# Patient Record
Sex: Male | Born: 1990 | Race: White | Hispanic: No | Marital: Single | Smoking: Never smoker
Health system: Southern US, Community
[De-identification: ages and names within clinical notes are randomized; demographics above are authoritative.]

## PROBLEM LIST (undated history)

## (undated) DIAGNOSIS — C439 Malignant melanoma of skin, unspecified: Secondary | ICD-10-CM

## (undated) HISTORY — PX: TONSILLECTOMY: SUR1361

---

## 2010-04-10 ENCOUNTER — Emergency Department: Payer: Self-pay | Admitting: Emergency Medicine

## 2012-06-16 ENCOUNTER — Emergency Department: Payer: Self-pay | Admitting: Emergency Medicine

## 2012-06-16 LAB — URINALYSIS, COMPLETE
Bacteria: NONE SEEN
Bilirubin,UR: NEGATIVE
Glucose,UR: NEGATIVE mg/dL (ref 0–75)
Nitrite: NEGATIVE
Protein: NEGATIVE
Specific Gravity: 1.008 (ref 1.003–1.030)
Squamous Epithelial: NONE SEEN
WBC UR: 2 /HPF (ref 0–5)

## 2012-06-16 IMAGING — US US PELVIS LIMITED
1 series · 14 of 25 positions shown · non-contrast
Comparison: None

REASON FOR EXAM: pain/stinging pain to left testiclar area
COMMENTS:   LMP: (Male)

PROCEDURE:     US  - US TESTICULAR  - June 16, 2012  [DATE]
RESULT:     Indication: Pain
TECHNIQUE: Multiple gray-scale, color-flow Doppler, and spectral waveform
tracings of the testicles and testicular vasculature are presented for
review.

[Series 1: us pelvis limited · 0.08mm/px · 14 of 72 slices shown]
[im 1/72]
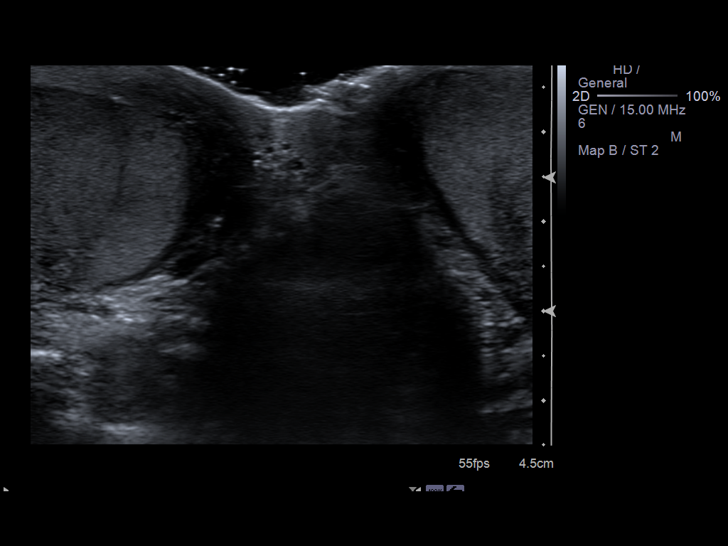
[im 6/72]
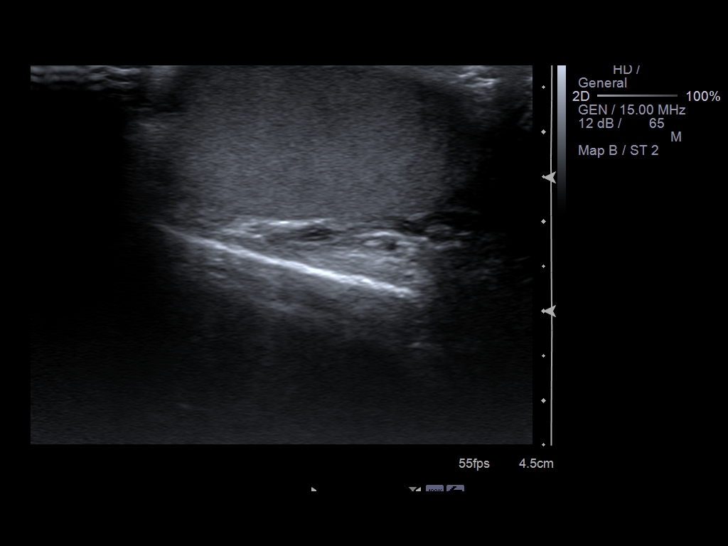
[im 12/72]
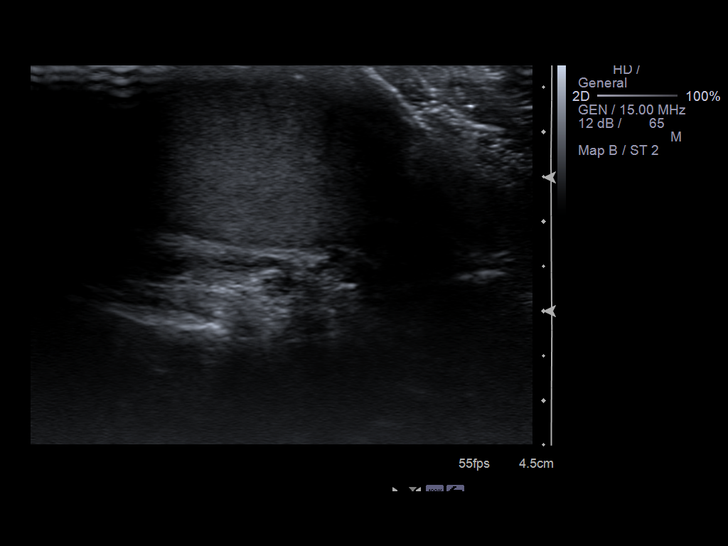
[im 18/72]
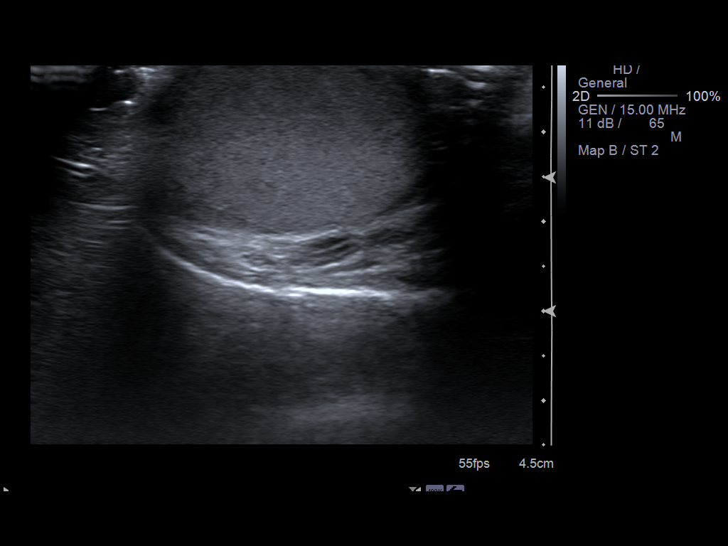
[im 24/72]
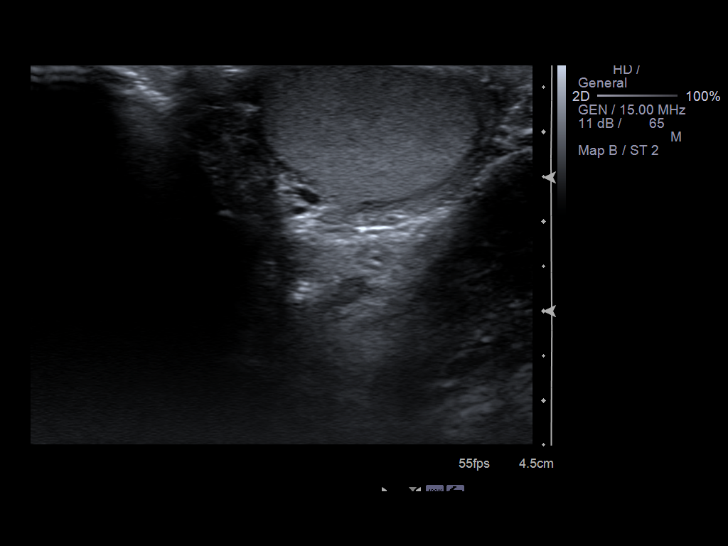
[im 27/72]
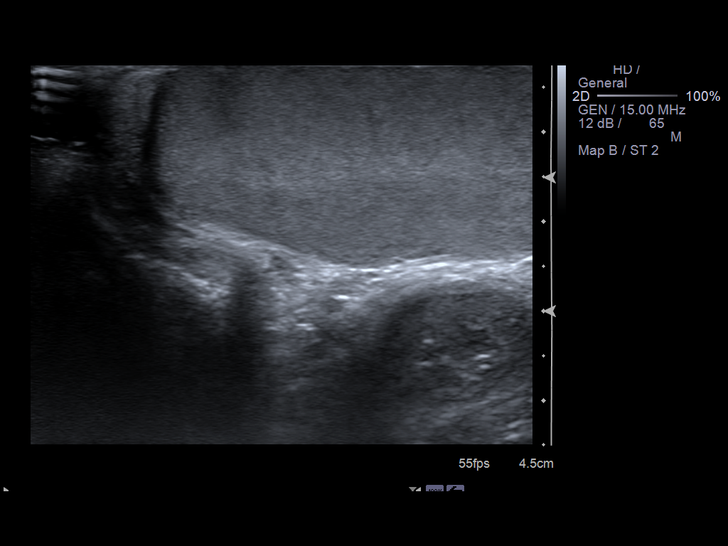
[im 33/72]
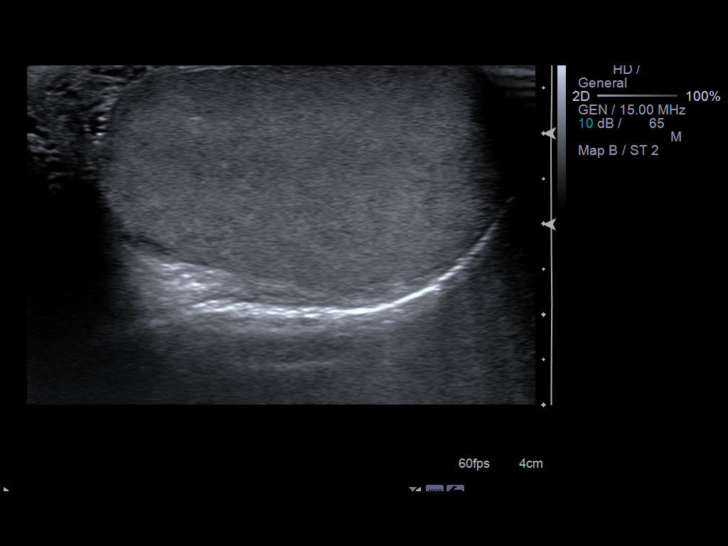
[im 39/72]
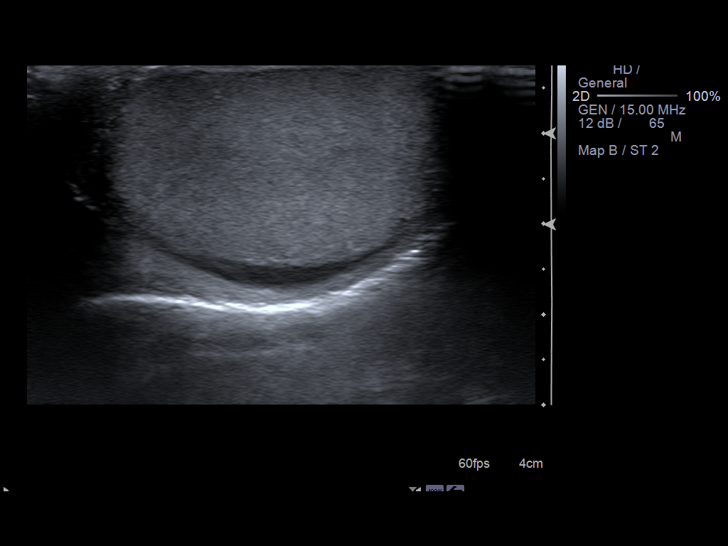
[im 45/72]
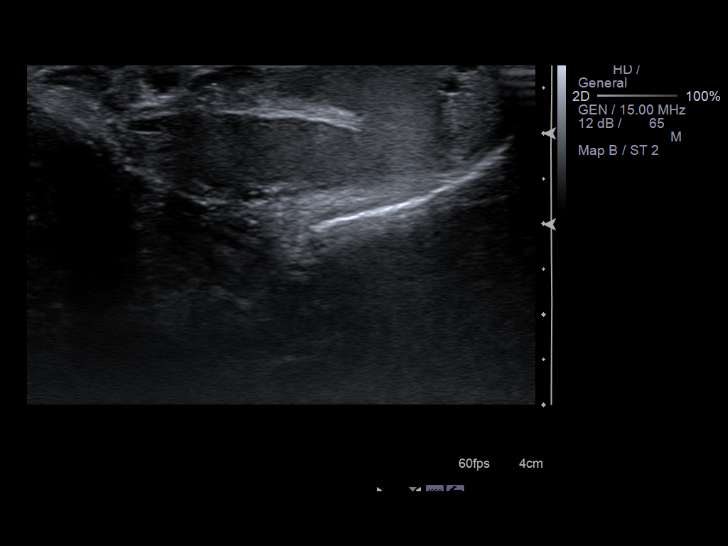
[im 48/72]
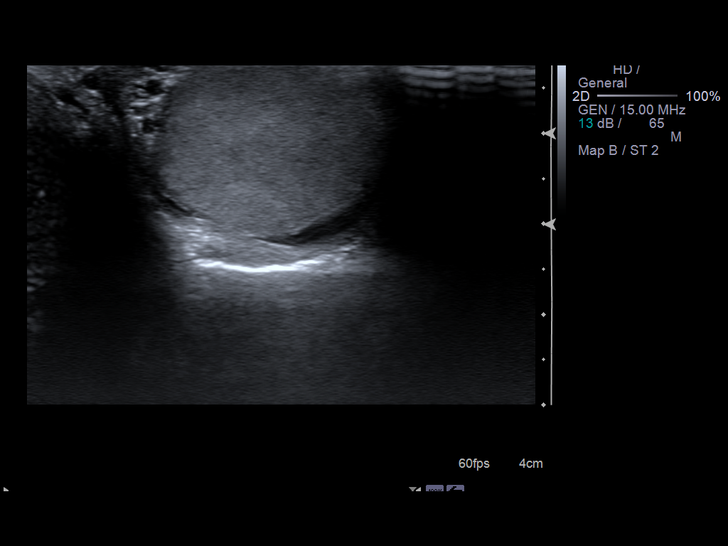
[im 54/72]
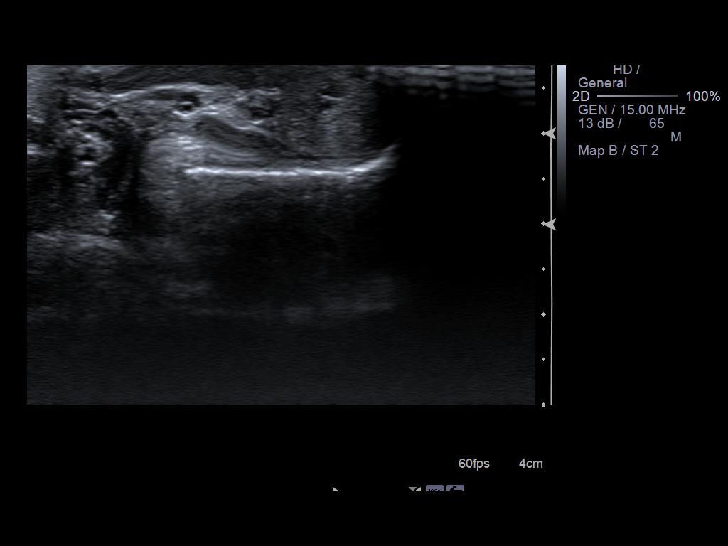
[im 60/72]
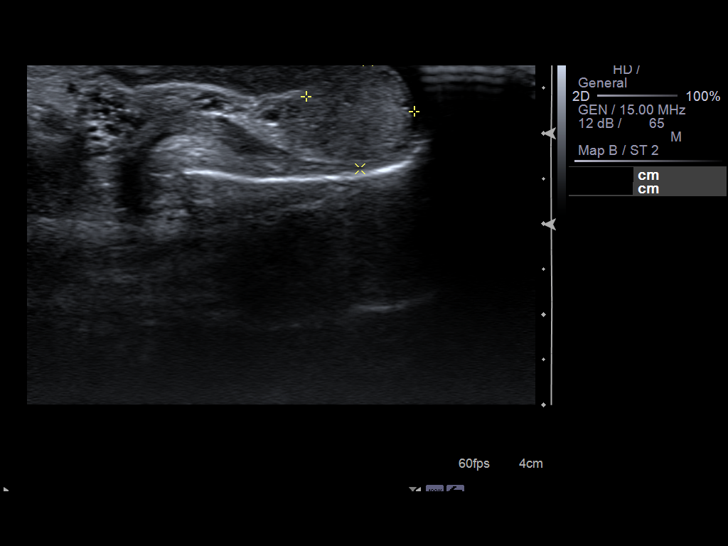
[im 66/72]
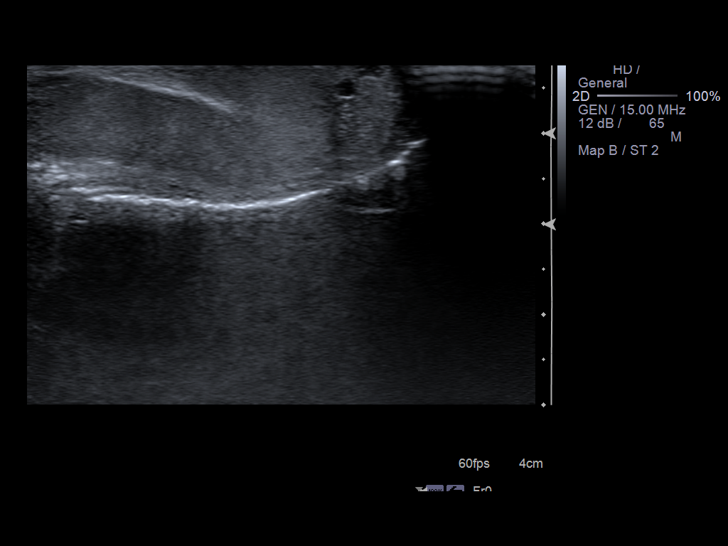
[im 72/72]
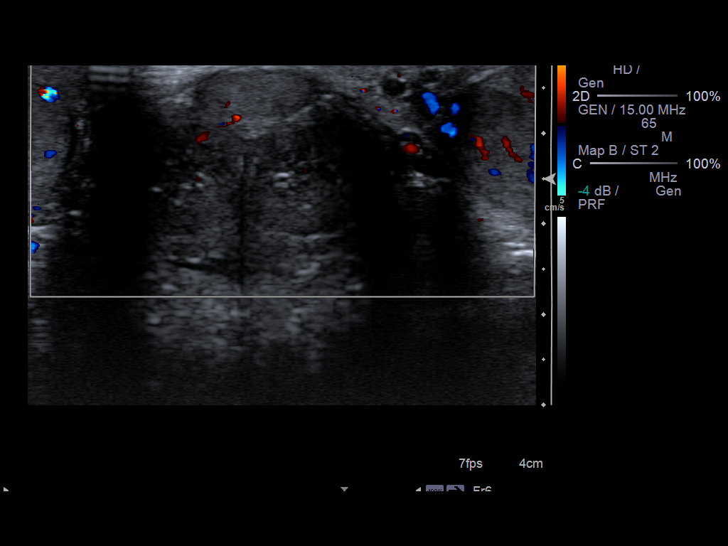

[14 of 25 positions shown; findings below may reference images not displayed]

FINDINGS: The right testicle is of normal echogenicity and measures 4.6 x 3.6 x
cm. No focal mass lesions. There is normal arterial and venous blood flow.
The right epididymal head measures 0.6 cm and is normal in appearance. No
right varicocele or significant hydrocele.

The left testicle is of normal echogenicity and measures 4.5 x 3.4 x 2.7 cm.
No focal mass lesions. There is normal arterial and venous blood flow.  The
left epididymal head measures 1.2 cm and is normal in appearance. There is a
small left epididymal cyst. No left varicocele or significant hydrocele.
IMPRESSION: 1. No active testicular torsion.

[REDACTED]

## 2012-08-05 ENCOUNTER — Emergency Department: Payer: Self-pay | Admitting: Emergency Medicine

## 2012-08-05 IMAGING — CR DG CLAVICLE*R*
1 series · 2 of 2 positions shown · non-contrast
Comparison: none

REASON FOR EXAM: injury
COMMENTS:   May transport without cardiac monitor

PROCEDURE:     DXR - DXR CLAVICLE RIGHT  - August 05, 2012  [DATE]
RESULT:     Clavicle evaluation 08/05/2012.

[Series 1: w clavicle ap right · 0.14mm/px · 2 of 2 slices shown]
[im 1/2]
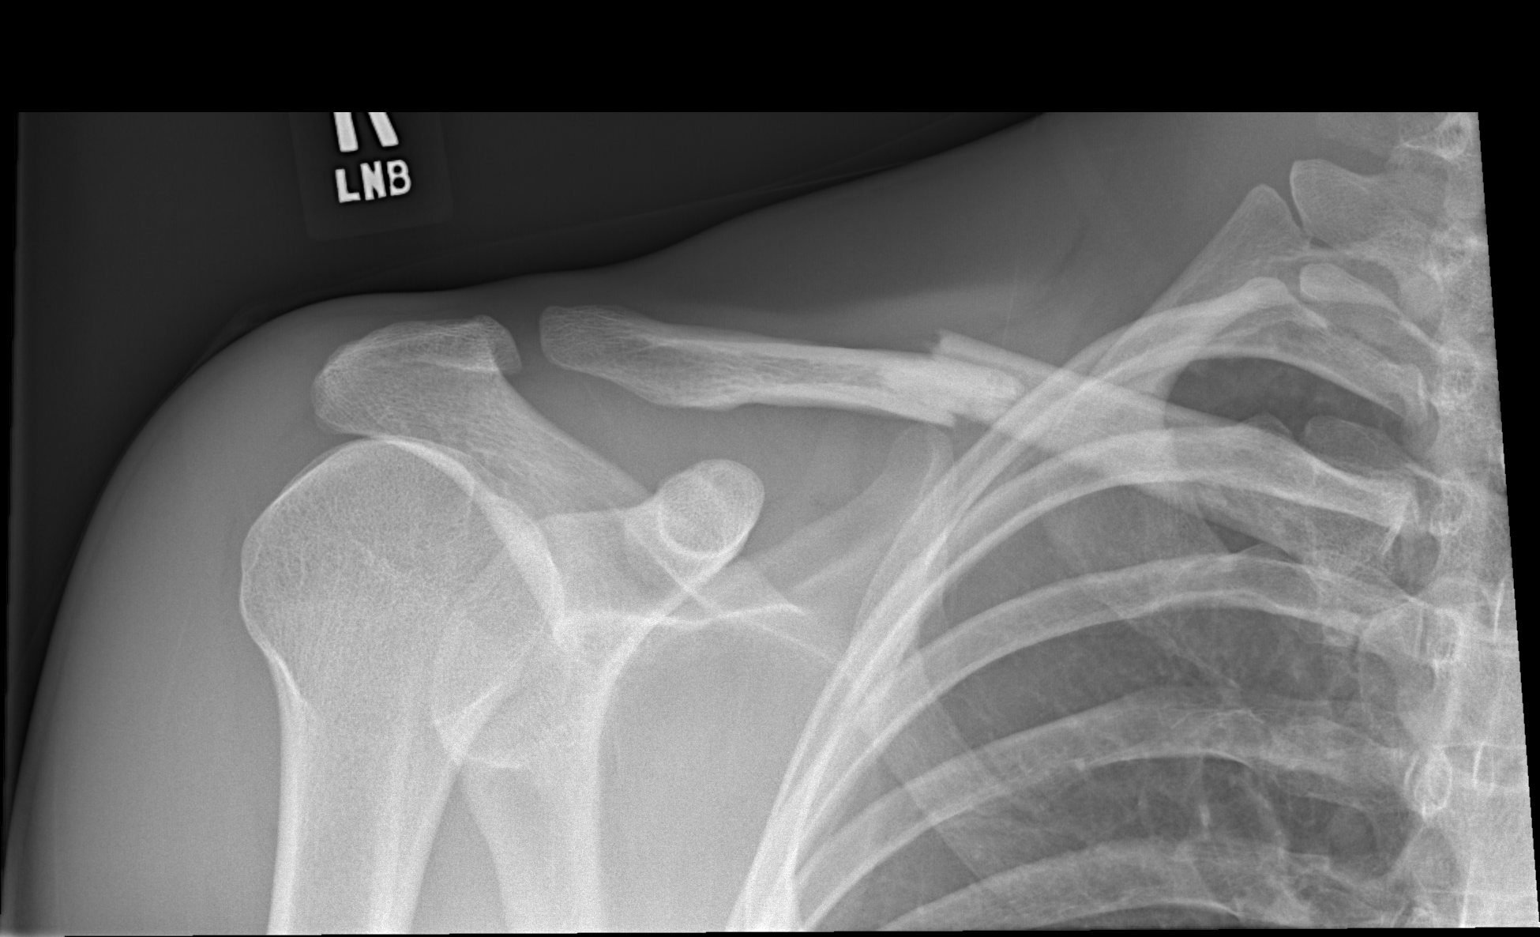
[im 2/2]
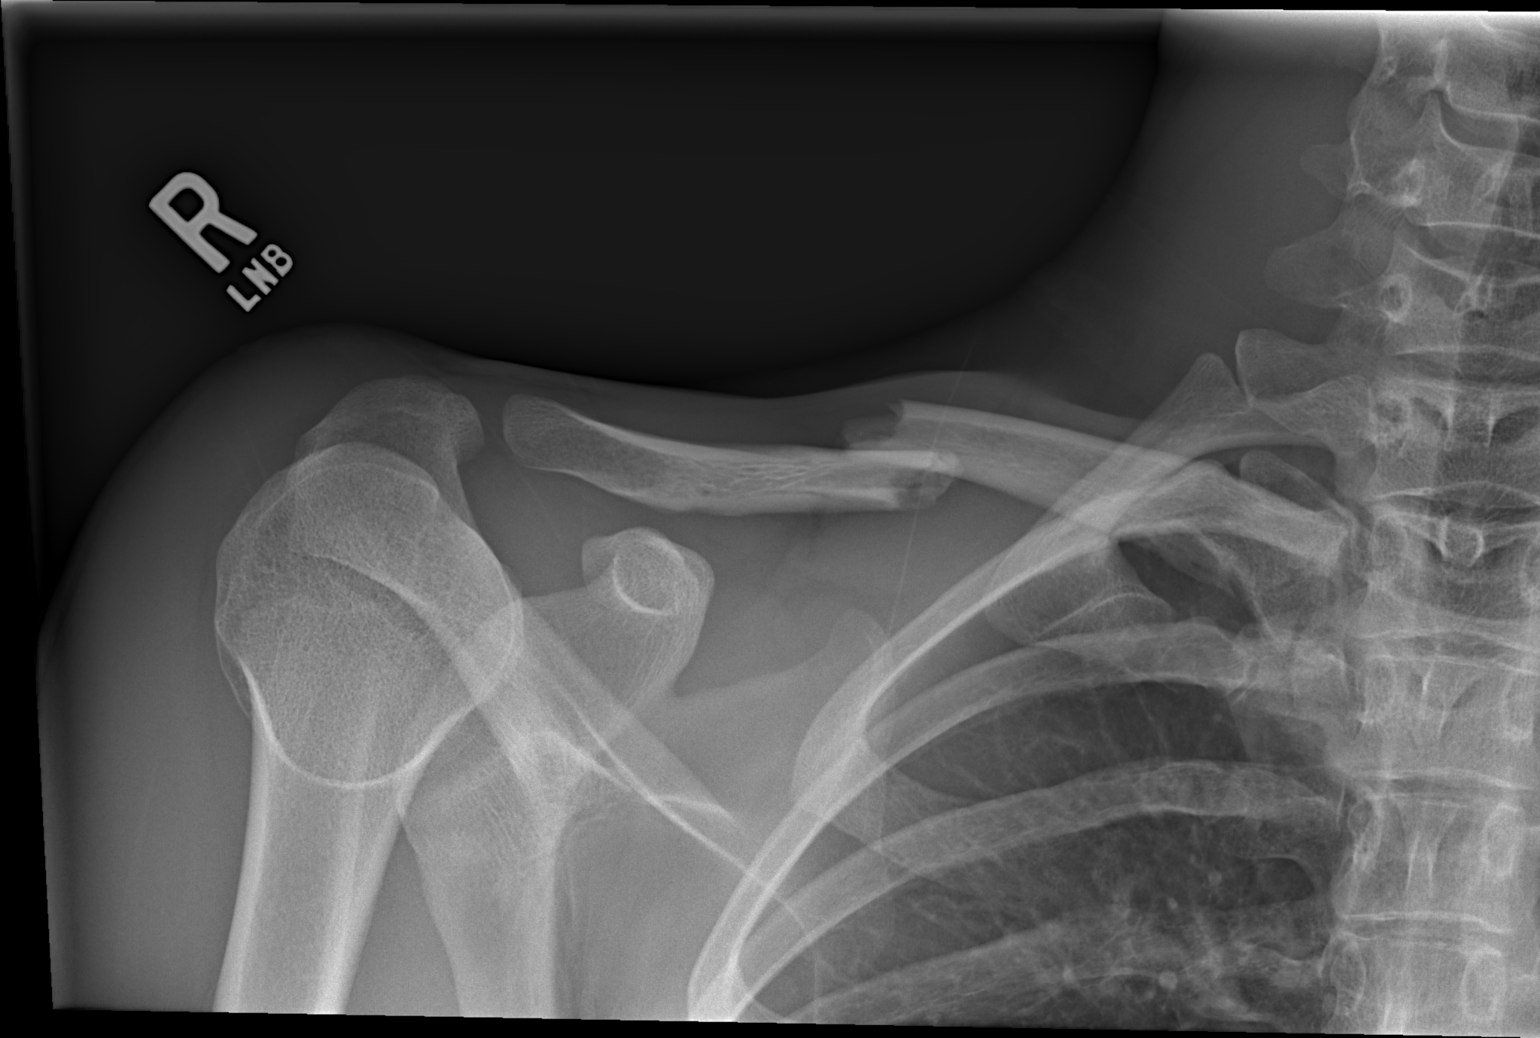

[2 of 2 positions shown; findings below may reference images not displayed]

FINDINGS: A midclavicular fracture is identified with approximately 2 cm of
override. There is no evidence of pneumothorax. The distal fracture fragment
is inferiorly displaced.
IMPRESSION: Midclavicular fracture as described above.

## 2018-12-16 DIAGNOSIS — Z0000 Encounter for general adult medical examination without abnormal findings: Secondary | ICD-10-CM | POA: Diagnosis not present

## 2019-04-21 ENCOUNTER — Other Ambulatory Visit: Payer: Self-pay

## 2019-04-21 DIAGNOSIS — Z20822 Contact with and (suspected) exposure to covid-19: Secondary | ICD-10-CM

## 2019-04-22 LAB — NOVEL CORONAVIRUS, NAA: SARS-CoV-2, NAA: NOT DETECTED

## 2019-11-24 ENCOUNTER — Ambulatory Visit: Payer: Self-pay | Attending: Internal Medicine

## 2019-11-24 DIAGNOSIS — Z23 Encounter for immunization: Secondary | ICD-10-CM

## 2019-11-24 NOTE — Progress Notes (Signed)
   Covid-19 Vaccination Clinic  Name:  Lydon Wenzinger    MRN: [id number] DOB: 05-01-1991  11/24/2019  Mr. Carta was observed post Covid-19 immunization for 15 minutes without incident. He was provided with Vaccine Information Sheet and instruction to access the V-Safe system.   Mr. Spofford was instructed to call 911 with any severe reactions post vaccine: Marland Kitchen Difficulty breathing  . Swelling of face and throat  . A fast heartbeat  . A bad rash all over body  . Dizziness and weakness   Immunizations Administered    Name Date Dose VIS Date Route   Moderna COVID-19 Vaccine 11/24/2019  1:30 PM 0.5 mL 08/15/2019 Intramuscular   Manufacturer: Moderna   Lot: [id number]   CecilPO:[id number]

## 2019-12-26 ENCOUNTER — Ambulatory Visit: Payer: Self-pay | Attending: Internal Medicine

## 2019-12-26 DIAGNOSIS — Z23 Encounter for immunization: Secondary | ICD-10-CM

## 2019-12-26 NOTE — Progress Notes (Signed)
   Covid-19 Vaccination Clinic  Name:  Hunter Sweeney    MRN: [id number] DOB: 09-29-1990  12/26/2019  Hunter Sweeney was observed post Covid-19 immunization for 15 minutes without incident. He was provided with Vaccine Information Sheet and instruction to access the V-Safe system.   Hunter Sweeney was instructed to call 911 with any severe reactions post vaccine: Marland Kitchen Difficulty breathing  . Swelling of face and throat  . A fast heartbeat  . A bad rash all over body  . Dizziness and weakness   Immunizations Administered    Name Date Dose VIS Date Route   Moderna COVID-19 Vaccine 12/26/2019 12:58 PM 0.5 mL 08/15/2019 Intramuscular   Manufacturer: Moderna   Lot: [id number]   DiablockVO:[id number]

## 2020-03-22 ENCOUNTER — Emergency Department: Payer: 59

## 2020-03-22 ENCOUNTER — Other Ambulatory Visit: Payer: Self-pay

## 2020-03-22 ENCOUNTER — Encounter
Admission: EM | Disposition: A | Discharge status: Home / Self Care | Payer: Self-pay | Source: Home / Self Care | Attending: Emergency Medicine

## 2020-03-22 ENCOUNTER — Observation Stay
Admission: EM | Admit: 2020-03-22 | Discharge: 2020-03-23 | Disposition: A | Discharge status: Home / Self Care | Payer: 59 | Attending: Surgery | Admitting: Surgery

## 2020-03-22 ENCOUNTER — Emergency Department: Payer: 59 | Admitting: Anesthesiology

## 2020-03-22 DIAGNOSIS — Z20822 Contact with and (suspected) exposure to covid-19: Secondary | ICD-10-CM | POA: Insufficient documentation

## 2020-03-22 DIAGNOSIS — K3580 Unspecified acute appendicitis: Secondary | ICD-10-CM

## 2020-03-22 DIAGNOSIS — K3589 Other acute appendicitis without perforation or gangrene: Secondary | ICD-10-CM | POA: Diagnosis not present

## 2020-03-22 DIAGNOSIS — K37 Unspecified appendicitis: Secondary | ICD-10-CM | POA: Diagnosis present

## 2020-03-22 DIAGNOSIS — R1031 Right lower quadrant pain: Secondary | ICD-10-CM | POA: Diagnosis present

## 2020-03-22 HISTORY — PX: XI ROBOTIC LAPAROSCOPIC ASSISTED APPENDECTOMY: SHX6877

## 2020-03-22 HISTORY — DX: Malignant melanoma of skin, unspecified: C43.9

## 2020-03-22 LAB — CBC
HCT: 43.6 % (ref 39.0–52.0)
Hemoglobin: 15.2 g/dL (ref 13.0–17.0)
MCH: 30.1 pg (ref 26.0–34.0)
MCHC: 34.9 g/dL (ref 30.0–36.0)
MCV: 86.3 fL (ref 80.0–100.0)
Platelets: 251 10*3/uL (ref 150–400)
RBC: 5.05 MIL/uL (ref 4.22–5.81)
RDW: 12.4 % (ref 11.5–15.5)
WBC: 11.3 10*3/uL — ABNORMAL HIGH (ref 4.0–10.5)
nRBC: 0 % (ref 0.0–0.2)

## 2020-03-22 LAB — URINALYSIS, COMPLETE (UACMP) WITH MICROSCOPIC
Bacteria, UA: NONE SEEN
Bilirubin Urine: NEGATIVE
Glucose, UA: NEGATIVE mg/dL
Hgb urine dipstick: NEGATIVE
Ketones, ur: NEGATIVE mg/dL
Leukocytes,Ua: NEGATIVE
Nitrite: NEGATIVE
Protein, ur: NEGATIVE mg/dL
Specific Gravity, Urine: 1.024 (ref 1.005–1.030)
Squamous Epithelial / HPF: NONE SEEN (ref 0–5)
pH: 5 (ref 5.0–8.0)

## 2020-03-22 LAB — SARS CORONAVIRUS 2 BY RT PCR (HOSPITAL ORDER, PERFORMED IN ~~LOC~~ HOSPITAL LAB): SARS Coronavirus 2: NEGATIVE

## 2020-03-22 LAB — BASIC METABOLIC PANEL
Anion gap: 11 (ref 5–15)
BUN: 16 mg/dL (ref 6–20)
CO2: 24 mmol/L (ref 22–32)
Calcium: 9.2 mg/dL (ref 8.9–10.3)
Chloride: 100 mmol/L (ref 98–111)
Creatinine, Ser: 0.99 mg/dL (ref 0.61–1.24)
GFR calc Af Amer: >60 mL/min (ref >60)
GFR calc non Af Amer: >60 mL/min (ref >60)
Glucose, Bld: 139 mg/dL — ABNORMAL HIGH (ref 70–99)
Potassium: 4 mmol/L (ref 3.5–5.1)
Sodium: 135 mmol/L (ref 135–145)

## 2020-03-22 IMAGING — CT CT ABD-PELV W/ CM
2 of 4 series · 16 of 46 positions shown, 18 images · IV contrast (APPLIED)
Comparison: None.

CLINICAL DATA: Acute right lower quadrant pain since last night

EXAM:
CT ABDOMEN AND PELVIS WITH CONTRAST
TECHNIQUE: Multidetector CT imaging of the abdomen and pelvis was performed
using the standard protocol following bolus administration of
intravenous contrast.
CONTRAST:  100mL OMNIPAQUE IOHEXOL 300 MG/ML  SOLN

[Series 2: routine abd/pel with · axial · 0.68mm/px · z∈[-1063,-633]mm · 13 of 94 slices shown, 15 images]
[im 4/94  soft-tissue]
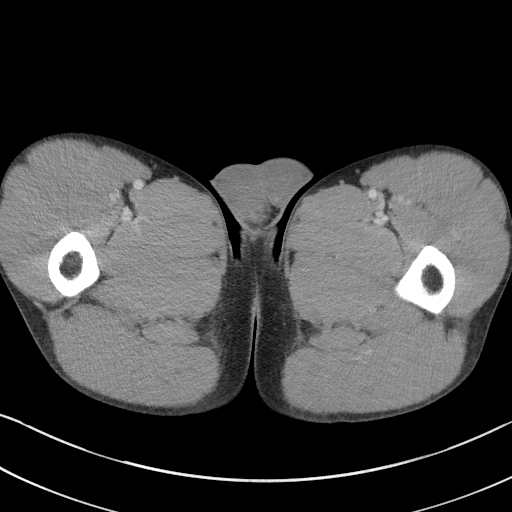
[im 4/94  bone]
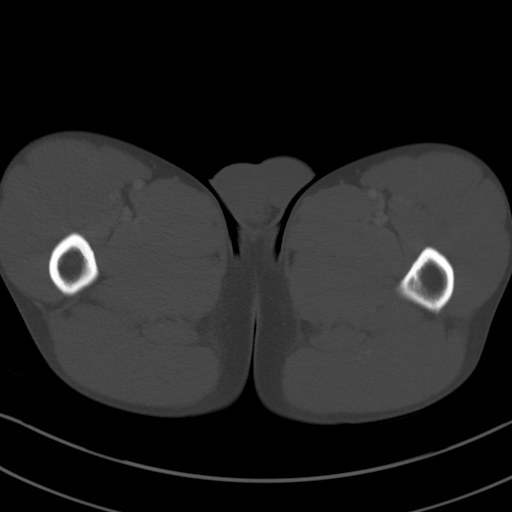
[im 12/94  soft-tissue]
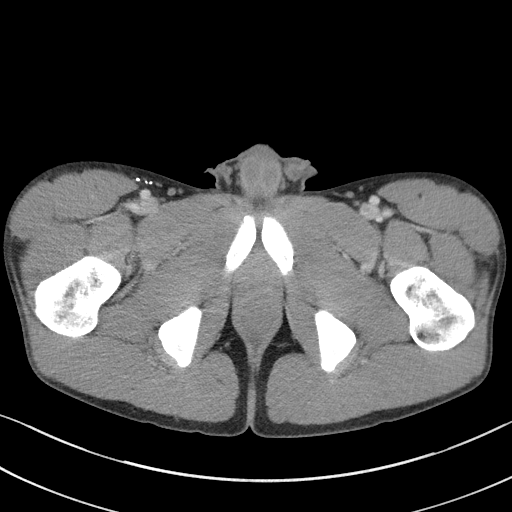
[im 20/94  soft-tissue]
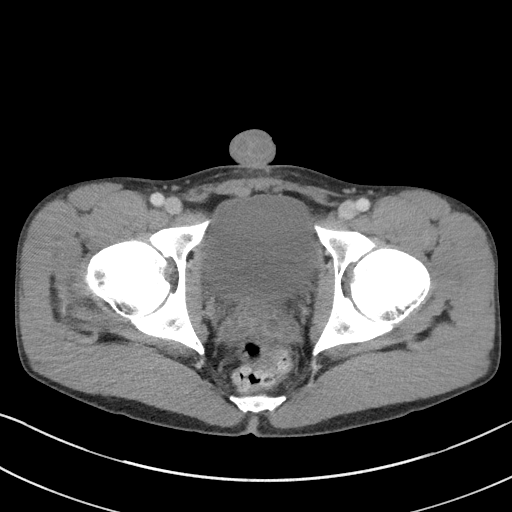
[im 28/94  soft-tissue]
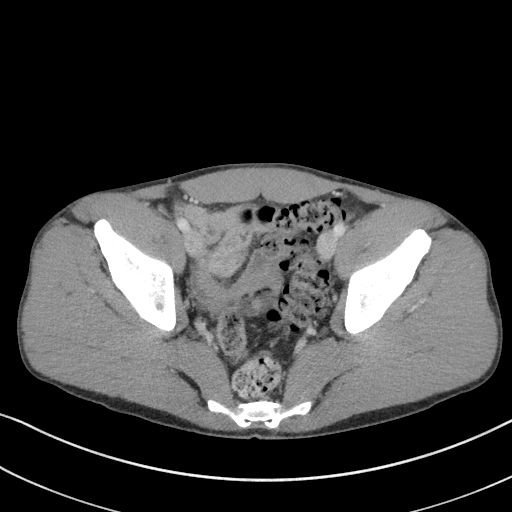
[im 32/94  soft-tissue]
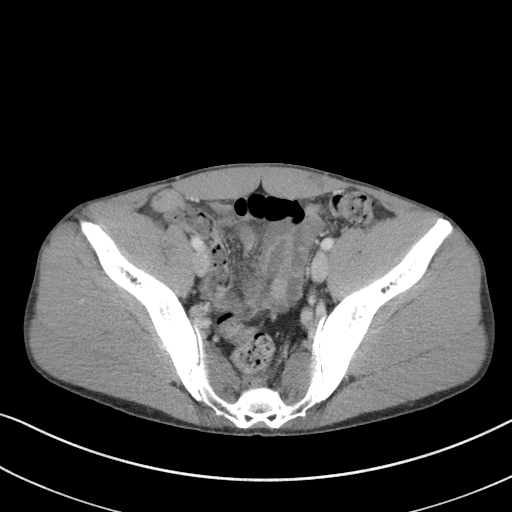
[im 39/94  soft-tissue]
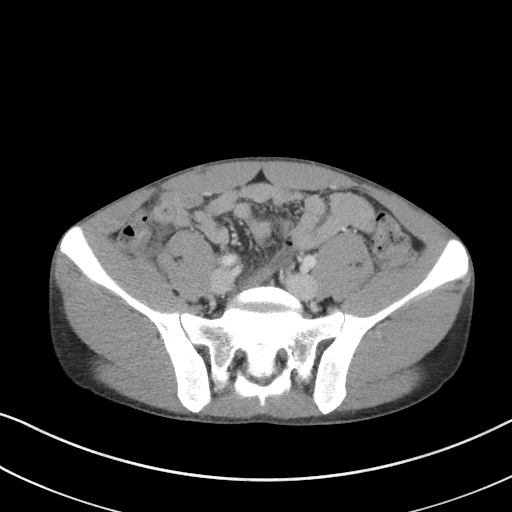
[im 47/94  soft-tissue]
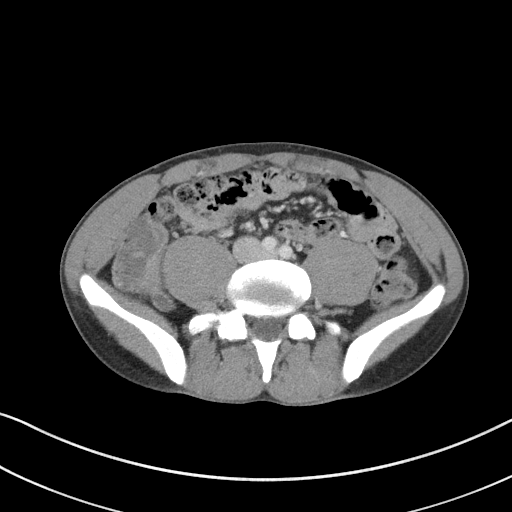
[im 55/94  soft-tissue]
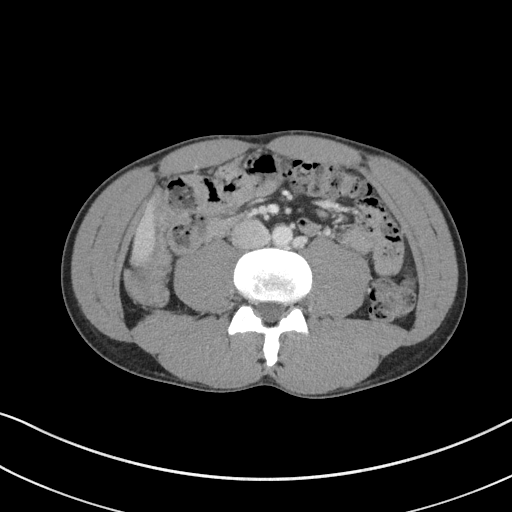
[im 63/94  soft-tissue]
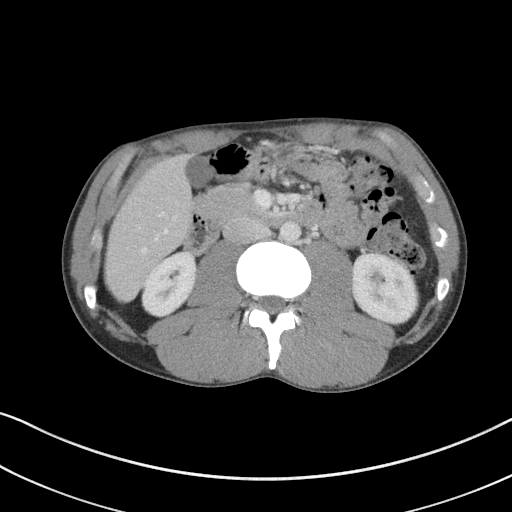
[im 63/94  bone]
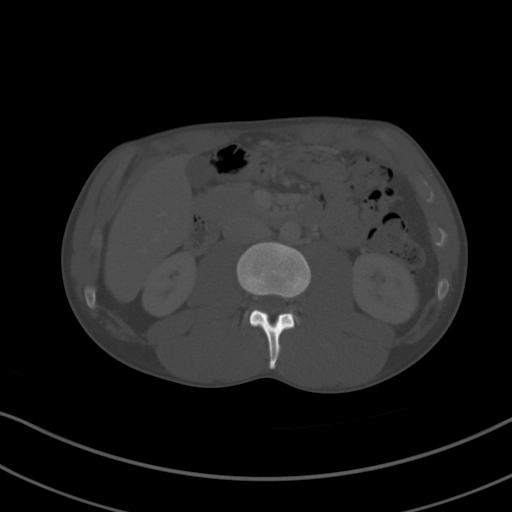
[im 66/94  soft-tissue]
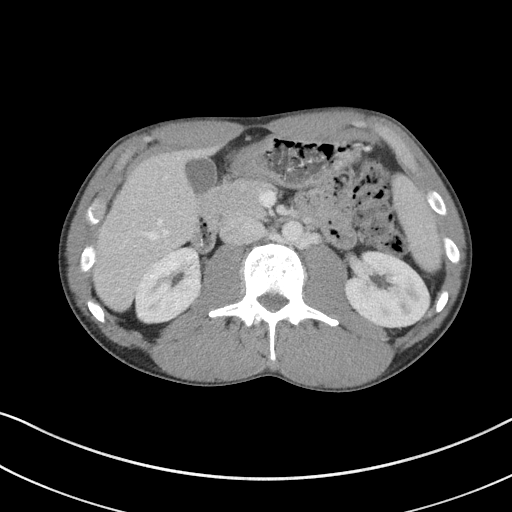
[im 74/94  soft-tissue]
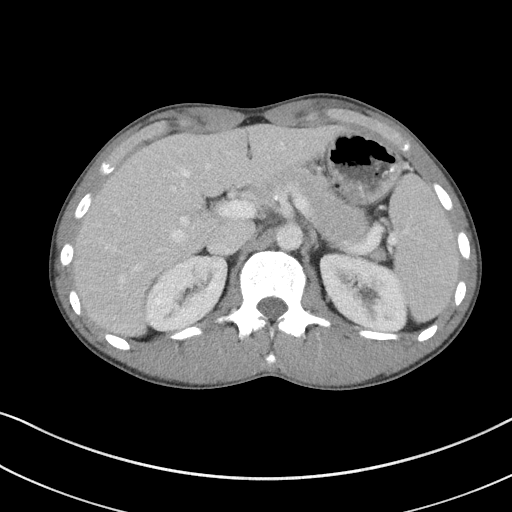
[im 82/94  soft-tissue]
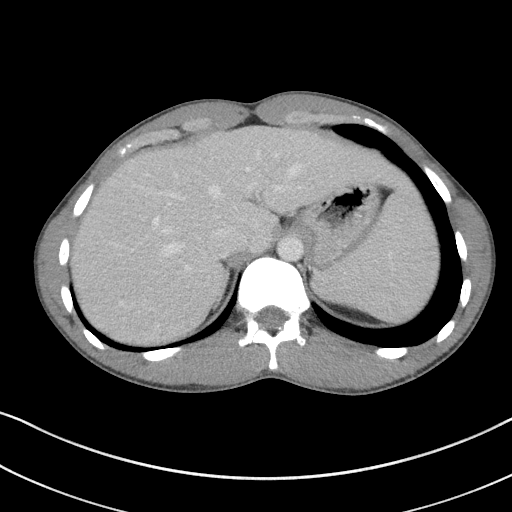
[im 90/94  soft-tissue]
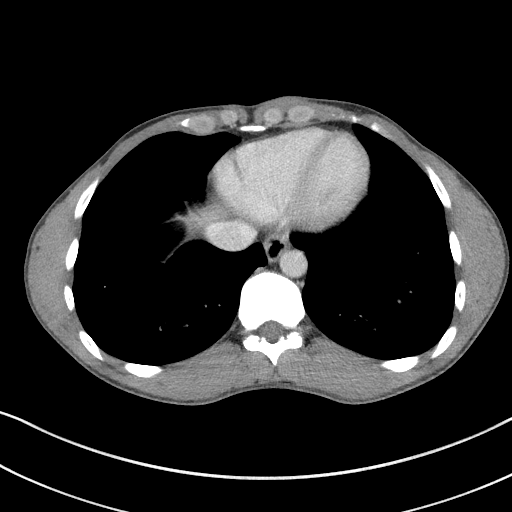

[Series 5: coronal st · coronal · 0.71mm/px · 3 of 81 slices shown]
[im 27/81  soft-tissue]
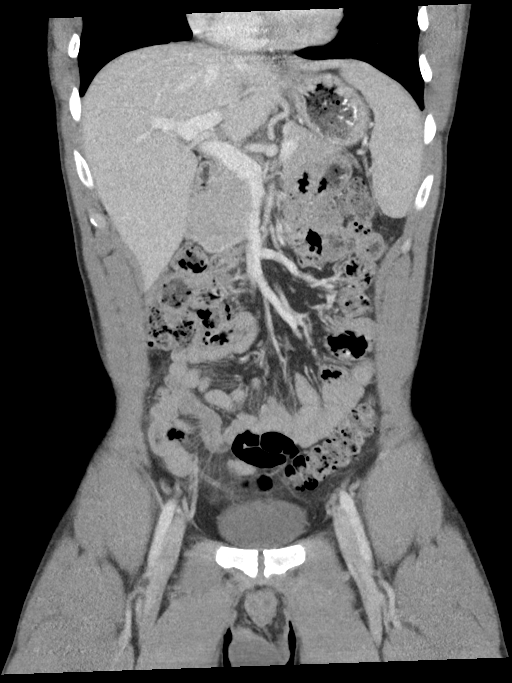
[im 36/81  soft-tissue]
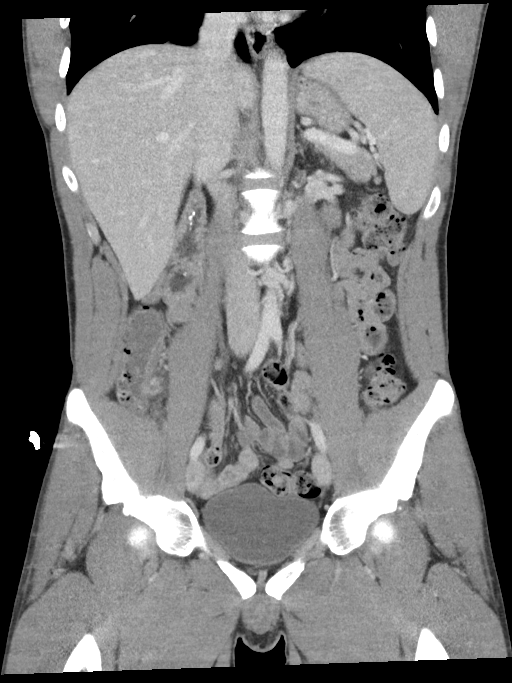
[im 45/81  soft-tissue]
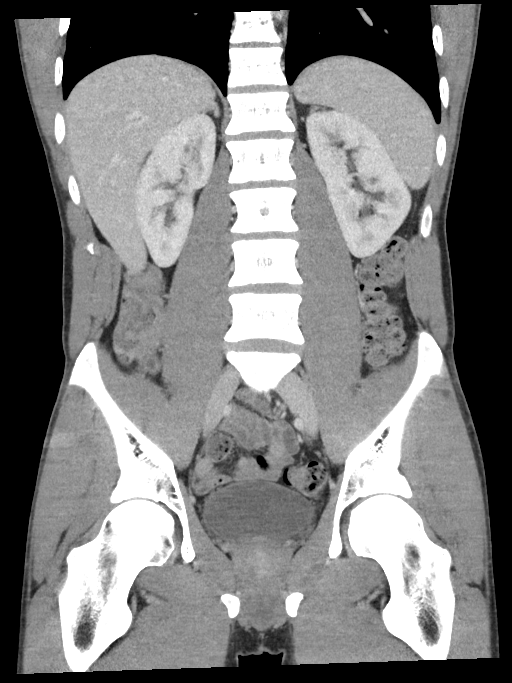

[16 of 46 positions shown; findings below may reference images not displayed]

FINDINGS: Lower chest: No acute abnormality.

Hepatobiliary: No focal liver abnormality is seen. No gallstones,
gallbladder wall thickening, or biliary dilatation.

Pancreas: Unremarkable. No pancreatic ductal dilatation or
surrounding inflammatory changes.

Spleen: Normal in size without focal abnormality.

Adrenals/Urinary Tract: Adrenal glands are unremarkable. Kidneys are
normal, without renal calculi, focal lesion, or hydronephrosis.
Bladder is unremarkable.

Stomach/Bowel: Negative for bowel obstruction, significant
dilatation, ileus, or free air.

In the right lower quadrant the appendix is dilated with mucosal
enhancement and surrounding inflammation. Retrocecal location.
Appearance compatible with acute nonruptured appendicitis.

Appendix: Location: Retrocecal

Diameter: 11 mm

Appendicolith: Not visualized

Mucosal hyper-enhancement: Present

Extraluminal gas: None.

Periappendiceal collection: None.

No significant fluid collection, abscess, hemorrhage or hematoma.
Trace periappendiceal fluid and trace dependent fluid in the pelvis.

Vascular/Lymphatic: No bulky adenopathy

Reproductive: No significant finding by CT

Other: Intact abdominal wall.  No hernia.  No inguinal abnormality.

Musculoskeletal: No acute osseous finding.
IMPRESSION: Acute retrocecal nonruptured appendicitis.

## 2020-03-22 SURGERY — APPENDECTOMY, ROBOT-ASSISTED, LAPAROSCOPIC
Anesthesia: General | Site: Abdomen

## 2020-03-22 MED ORDER — GLYCOPYRROLATE 0.2 MG/ML IJ SOLN
INTRAMUSCULAR | Status: DC | PRN
  Administered 2020-03-22: .2 mg via INTRAVENOUS

## 2020-03-22 MED ORDER — SUCCINYLCHOLINE CHLORIDE 200 MG/10ML IV SOSY
PREFILLED_SYRINGE | INTRAVENOUS | Status: AC
  Filled 2020-03-22: qty 10

## 2020-03-22 MED ORDER — METRONIDAZOLE IN NACL 5-0.79 MG/ML-% IV SOLN
500.0000 mg | Freq: Three times a day (TID) | INTRAVENOUS | Status: DC
  Administered 2020-03-22: 500 mg via INTRAVENOUS
  Filled 2020-03-22 (×3): qty 100

## 2020-03-22 MED ORDER — ACETAMINOPHEN 325 MG PO TABS
650.0000 mg | ORAL_TABLET | Freq: Four times a day (QID) | ORAL | Status: DC
  Administered 2020-03-23: 650 mg via ORAL
  Filled 2020-03-22: qty 2

## 2020-03-22 MED ORDER — PROPOFOL 10 MG/ML IV BOLUS
INTRAVENOUS | Status: DC | PRN
  Administered 2020-03-22: 200 mg via INTRAVENOUS

## 2020-03-22 MED ORDER — HYDROCODONE-ACETAMINOPHEN 5-325 MG PO TABS
1.0000 | ORAL_TABLET | ORAL | Status: DC | PRN
  Administered 2020-03-22: 2 via ORAL
  Administered 2020-03-23: 1 via ORAL
  Filled 2020-03-22: qty 1
  Filled 2020-03-22: qty 2

## 2020-03-22 MED ORDER — LACTATED RINGERS IV SOLN: INTRAVENOUS | Status: DC

## 2020-03-22 MED ORDER — ONDANSETRON HCL 4 MG/2ML IJ SOLN
4.0000 mg | Freq: Once | INTRAMUSCULAR | Status: AC
  Administered 2020-03-22: 4 mg via INTRAVENOUS
  Filled 2020-03-22: qty 2

## 2020-03-22 MED ORDER — ONDANSETRON HCL 4 MG/2ML IJ SOLN
INTRAMUSCULAR | Status: DC | PRN
  Administered 2020-03-22: 4 mg via INTRAVENOUS

## 2020-03-22 MED ORDER — DEXAMETHASONE SODIUM PHOSPHATE 10 MG/ML IJ SOLN
INTRAMUSCULAR | Status: AC
  Filled 2020-03-22: qty 1

## 2020-03-22 MED ORDER — MIDAZOLAM HCL 2 MG/2ML IJ SOLN
INTRAMUSCULAR | Status: AC
  Filled 2020-03-22: qty 2

## 2020-03-22 MED ORDER — GLYCOPYRROLATE 0.2 MG/ML IJ SOLN
INTRAMUSCULAR | Status: AC
  Filled 2020-03-22: qty 1

## 2020-03-22 MED ORDER — LIDOCAINE-EPINEPHRINE (PF) 1 %-1:200000 IJ SOLN
INTRAMUSCULAR | Status: AC
  Filled 2020-03-22: qty 30

## 2020-03-22 MED ORDER — ROCURONIUM BROMIDE 100 MG/10ML IV SOLN
INTRAVENOUS | Status: DC | PRN
  Administered 2020-03-22: 5 mg via INTRAVENOUS
  Administered 2020-03-22: 35 mg via INTRAVENOUS

## 2020-03-22 MED ORDER — OXYCODONE HCL 5 MG PO TABS: 5.0000 mg | ORAL_TABLET | Freq: Once | ORAL | Status: DC | PRN

## 2020-03-22 MED ORDER — BUPIVACAINE HCL (PF) 0.5 % IJ SOLN
INTRAMUSCULAR | Status: DC | PRN
  Administered 2020-03-22: 10 mL

## 2020-03-22 MED ORDER — ENOXAPARIN SODIUM 40 MG/0.4ML ~~LOC~~ SOLN
40.0000 mg | SUBCUTANEOUS | Status: DC
  Filled 2020-03-22: qty 0.4

## 2020-03-22 MED ORDER — MORPHINE SULFATE (PF) 2 MG/ML IV SOLN
1.0000 mg | INTRAVENOUS | Status: DC | PRN
  Administered 2020-03-23: 1 mg via INTRAVENOUS
  Filled 2020-03-22: qty 1

## 2020-03-22 MED ORDER — IOHEXOL 300 MG/ML  SOLN
100.0000 mL | Freq: Once | INTRAMUSCULAR | Status: AC | PRN
  Administered 2020-03-22: 100 mL via INTRAVENOUS

## 2020-03-22 MED ORDER — LIDOCAINE HCL (CARDIAC) PF 100 MG/5ML IV SOSY
PREFILLED_SYRINGE | INTRAVENOUS | Status: DC | PRN
  Administered 2020-03-22: 50 mg via INTRAVENOUS

## 2020-03-22 MED ORDER — CIPROFLOXACIN IN D5W 400 MG/200ML IV SOLN
400.0000 mg | INTRAVENOUS | Status: DC
  Administered 2020-03-22: 400 mg via INTRAVENOUS
  Filled 2020-03-22 (×2): qty 200

## 2020-03-22 MED ORDER — BUPIVACAINE HCL (PF) 0.5 % IJ SOLN
INTRAMUSCULAR | Status: AC
  Filled 2020-03-22: qty 30

## 2020-03-22 MED ORDER — ONDANSETRON HCL 4 MG/2ML IJ SOLN: 4.0000 mg | Freq: Once | INTRAMUSCULAR | Status: DC | PRN

## 2020-03-22 MED ORDER — MIDAZOLAM HCL 2 MG/2ML IJ SOLN
INTRAMUSCULAR | Status: DC | PRN
  Administered 2020-03-22: 2 mg via INTRAVENOUS

## 2020-03-22 MED ORDER — ROCURONIUM BROMIDE 10 MG/ML (PF) SYRINGE
PREFILLED_SYRINGE | INTRAVENOUS | Status: AC
  Filled 2020-03-22: qty 10

## 2020-03-22 MED ORDER — LIDOCAINE HCL (PF) 2 % IJ SOLN
INTRAMUSCULAR | Status: AC
  Filled 2020-03-22: qty 5

## 2020-03-22 MED ORDER — ONDANSETRON 4 MG PO TBDP: 4.0000 mg | ORAL_TABLET | Freq: Four times a day (QID) | ORAL | Status: DC | PRN

## 2020-03-22 MED ORDER — KETOROLAC TROMETHAMINE 30 MG/ML IJ SOLN
INTRAMUSCULAR | Status: AC
  Filled 2020-03-22: qty 1

## 2020-03-22 MED ORDER — FENTANYL CITRATE (PF) 250 MCG/5ML IJ SOLN
INTRAMUSCULAR | Status: AC
  Filled 2020-03-22: qty 5

## 2020-03-22 MED ORDER — OXYCODONE HCL 5 MG/5ML PO SOLN: 5.0000 mg | Freq: Once | ORAL | Status: DC | PRN

## 2020-03-22 MED ORDER — FENTANYL CITRATE (PF) 100 MCG/2ML IJ SOLN
INTRAMUSCULAR | Status: DC | PRN
  Administered 2020-03-22: 50 ug via INTRAVENOUS
  Administered 2020-03-22: 100 ug via INTRAVENOUS
  Administered 2020-03-22 (×2): 50 ug via INTRAVENOUS

## 2020-03-22 MED ORDER — LIDOCAINE-EPINEPHRINE (PF) 1 %-1:200000 IJ SOLN
INTRAMUSCULAR | Status: DC | PRN
  Administered 2020-03-22: 10 mL

## 2020-03-22 MED ORDER — KETOROLAC TROMETHAMINE 30 MG/ML IJ SOLN
INTRAMUSCULAR | Status: DC | PRN
Start: 2020-03-22 — End: 2020-03-22
  Administered 2020-03-22: 30 mg via INTRAVENOUS

## 2020-03-22 MED ORDER — DEXAMETHASONE SODIUM PHOSPHATE 10 MG/ML IJ SOLN
INTRAMUSCULAR | Status: DC | PRN
  Administered 2020-03-22: 10 mg via INTRAVENOUS

## 2020-03-22 MED ORDER — IBUPROFEN 400 MG PO TABS: 600.0000 mg | ORAL_TABLET | Freq: Four times a day (QID) | ORAL | Status: DC | PRN

## 2020-03-22 MED ORDER — FENTANYL CITRATE (PF) 100 MCG/2ML IJ SOLN
25.0000 ug | INTRAMUSCULAR | Status: DC | PRN
  Administered 2020-03-22 (×3): 25 ug via INTRAVENOUS

## 2020-03-22 MED ORDER — ONDANSETRON HCL 4 MG/2ML IJ SOLN: 4.0000 mg | Freq: Four times a day (QID) | INTRAMUSCULAR | Status: DC | PRN

## 2020-03-22 MED ORDER — SUGAMMADEX SODIUM 200 MG/2ML IV SOLN
INTRAVENOUS | Status: DC | PRN
  Administered 2020-03-22: 200 mg via INTRAVENOUS

## 2020-03-22 MED ORDER — SUCCINYLCHOLINE CHLORIDE 20 MG/ML IJ SOLN
INTRAMUSCULAR | Status: DC | PRN
  Administered 2020-03-22: 100 mg via INTRAVENOUS

## 2020-03-22 MED ORDER — ONDANSETRON HCL 4 MG/2ML IJ SOLN
INTRAMUSCULAR | Status: AC
  Filled 2020-03-22: qty 2

## 2020-03-22 MED ORDER — TRAMADOL HCL 50 MG PO TABS: 50.0000 mg | ORAL_TABLET | Freq: Four times a day (QID) | ORAL | Status: DC | PRN

## 2020-03-22 MED ORDER — MORPHINE SULFATE (PF) 4 MG/ML IV SOLN
4.0000 mg | Freq: Once | INTRAVENOUS | Status: DC
  Filled 2020-03-22: qty 1

## 2020-03-22 MED ORDER — FENTANYL CITRATE (PF) 100 MCG/2ML IJ SOLN
INTRAMUSCULAR | Status: AC
  Administered 2020-03-22: 25 ug via INTRAVENOUS
  Filled 2020-03-22: qty 2

## 2020-03-22 SURGICAL SUPPLY — 63 items
ANCHOR TIS RET SYS 235ML (MISCELLANEOUS) IMPLANT
BAG INFUSER PRESSURE 100CC (MISCELLANEOUS) IMPLANT
BLADE SURG SZ11 CARB STEEL (BLADE) ×3 IMPLANT
CANISTER SUCT 1200ML W/VALVE (MISCELLANEOUS) ×3 IMPLANT
CANNULA REDUC XI 12-8 STAPL (CANNULA) ×1
CANNULA REDUC XI 12-8MM STAPL (CANNULA) ×1
CANNULA REDUCER 12-8 DVNC XI (CANNULA) ×1 IMPLANT
CHLORAPREP W/TINT 26 (MISCELLANEOUS) ×3 IMPLANT
COVER TIP SHEARS 8 DVNC (MISCELLANEOUS) ×1 IMPLANT
COVER TIP SHEARS 8MM DA VINCI (MISCELLANEOUS) ×2
COVER WAND RF STERILE (DRAPES) IMPLANT
DEFOGGER SCOPE WARMER CLEARIFY (MISCELLANEOUS) ×3 IMPLANT
DERMABOND ADVANCED (GAUZE/BANDAGES/DRESSINGS) ×2
DERMABOND ADVANCED .7 DNX12 (GAUZE/BANDAGES/DRESSINGS) ×1 IMPLANT
DRAPE ARM DVNC X/XI (DISPOSABLE) ×4 IMPLANT
DRAPE COLUMN DVNC XI (DISPOSABLE) ×1 IMPLANT
DRAPE DA VINCI XI ARM (DISPOSABLE) ×8
DRAPE DA VINCI XI COLUMN (DISPOSABLE) ×2
ELECT CAUTERY BLADE 6.4 (BLADE) ×3 IMPLANT
ELECT REM PT RETURN 9FT ADLT (ELECTROSURGICAL) ×3
ELECTRODE REM PT RTRN 9FT ADLT (ELECTROSURGICAL) ×1 IMPLANT
GLOVE BIOGEL PI IND STRL 7.0 (GLOVE) ×2 IMPLANT
GLOVE BIOGEL PI INDICATOR 7.0 (GLOVE) ×4
GLOVE SURG SYN 6.5 ES PF (GLOVE) ×6 IMPLANT
GOWN STRL REUS W/ TWL LRG LVL3 (GOWN DISPOSABLE) ×3 IMPLANT
GOWN STRL REUS W/TWL LRG LVL3 (GOWN DISPOSABLE) ×6
GRASPER SUT TROCAR 14GX15 (MISCELLANEOUS) IMPLANT
IRRIGATOR SUCT 8 DISP DVNC XI (IRRIGATION / IRRIGATOR) IMPLANT
IRRIGATOR SUCTION 8MM XI DISP (IRRIGATION / IRRIGATOR)
IV NS 1000ML (IV SOLUTION)
IV NS 1000ML BAXH (IV SOLUTION) IMPLANT
KIT PINK PAD W/HEAD ARE REST (MISCELLANEOUS) ×3
KIT PINK PAD W/HEAD ARM REST (MISCELLANEOUS) ×1 IMPLANT
KIT TURNOVER KIT A (KITS) ×3 IMPLANT
LABEL OR SOLS (LABEL) IMPLANT
NEEDLE HYPO 22GX1.5 SAFETY (NEEDLE) ×3 IMPLANT
NEEDLE INSUFFLATION 14GA 120MM (NEEDLE) ×3 IMPLANT
OBTURATOR OPTICAL STANDARD 8MM (TROCAR) ×2
OBTURATOR OPTICAL STND 8 DVNC (TROCAR) ×1
OBTURATOR OPTICALSTD 8 DVNC (TROCAR) ×1 IMPLANT
PACK LAP CHOLECYSTECTOMY (MISCELLANEOUS) ×3 IMPLANT
PENCIL ELECTRO HAND CTR (MISCELLANEOUS) ×3 IMPLANT
RELOAD STAPLER 2.5X45 WHT DVNC (STAPLE) ×1 IMPLANT
RELOAD STAPLER 3.5X45 BLU DVNC (STAPLE) ×2 IMPLANT
SEAL CANN UNIV 5-8 DVNC XI (MISCELLANEOUS) ×3 IMPLANT
SEAL XI 5MM-8MM UNIVERSAL (MISCELLANEOUS) ×6
SET TUBE SMOKE EVAC HIGH FLOW (TUBING) ×3 IMPLANT
SOLUTION ELECTROLUBE (MISCELLANEOUS) ×3 IMPLANT
STAPLER 45 DA VINCI SURE FORM (STAPLE) ×2
STAPLER 45 SUREFORM DVNC (STAPLE) ×1 IMPLANT
STAPLER CANNULA SEAL DVNC XI (STAPLE) ×1 IMPLANT
STAPLER CANNULA SEAL XI (STAPLE) ×2
STAPLER RELOAD 2.5X45 WHITE (STAPLE) ×2
STAPLER RELOAD 2.5X45 WHT DVNC (STAPLE) ×1
STAPLER RELOAD 3.5X45 BLU DVNC (STAPLE) ×2
STAPLER RELOAD 3.5X45 BLUE (STAPLE) ×4
SUT MNCRL AB 4-0 PS2 18 (SUTURE) ×3 IMPLANT
SUT VIC AB 3-0 SH 27 (SUTURE) ×2
SUT VIC AB 3-0 SH 27X BRD (SUTURE) ×1 IMPLANT
SUT VICRYL 0 AB UR-6 (SUTURE) ×3 IMPLANT
SYR 30ML LL (SYRINGE) ×3 IMPLANT
SYSTEM WECK SHIELD CLOSURE (TROCAR) IMPLANT
TRAY FOLEY MTR SLVR 16FR STAT (SET/KITS/TRAYS/PACK) IMPLANT

## 2020-03-22 NOTE — ED Notes (Addendum)
Report given to [facility] in [place]. Patient is scheduled for procedure around 1900 today. Patient states he last ate around 0800 today which was two small pieces of cantaloupe and 1/2 glass of water.

## 2020-03-22 NOTE — ED Notes (Signed)
Pt given urine cup for collection when able

## 2020-03-22 NOTE — Anesthesia Preprocedure Evaluation (Signed)
Anesthesia Evaluation  Patient identified by MRN, date of birth, ID band Patient awake    Reviewed: Allergy & Precautions, H&P , NPO status , Patient's Chart, lab work & pertinent test results  Airway Mallampati: I  TM Distance: >3 FB Neck ROM: full    Dental  (+) Teeth Intact   Pulmonary neg pulmonary ROS,    Pulmonary exam normal        Cardiovascular negative cardio ROS Normal cardiovascular exam(-) dysrhythmias      Neuro/Psych negative neurological ROS  negative psych ROS   GI/Hepatic negative GI ROS, Neg liver ROS,   Endo/Other  negative endocrine ROS  Renal/GU      Musculoskeletal   Abdominal   Peds  Hematology negative hematology ROS (+)   Anesthesia Other Findings Past Medical History: No date: Melanoma ([place])  Past Surgical History: No date: TONSILLECTOMY  BMI    Body Mass Index: 20.92 kg/m      Reproductive/Obstetrics negative OB ROS                             Anesthesia Physical Anesthesia Plan  ASA: I  Anesthesia Plan: General ETT   Post-op Pain Management:    Induction:   PONV Risk Score and Plan: Ondansetron, Dexamethasone, Midazolam and Treatment may vary due to age or medical condition  Airway Management Planned:   Additional Equipment:   Intra-op Plan:   Post-operative Plan:   Informed Consent: I have reviewed the patients History and Physical, chart, labs and discussed the procedure including the risks, benefits and alternatives for the proposed anesthesia with the patient or authorized representative who has indicated his/her understanding and acceptance.     Dental Advisory Given  Plan Discussed with: Anesthesiologist, CRNA and Surgeon  Anesthesia Plan Comments:         Anesthesia Quick Evaluation

## 2020-03-22 NOTE — Transfer of Care (Signed)
Immediate Anesthesia Transfer of Care Note  Patient: Hunter Sweeney  Procedure(s) Performed: XI ROBOTIC LAPAROSCOPIC ASSISTED APPENDECTOMY (N/A Abdomen)  Patient Location: PACU  Anesthesia Type:General  Level of Consciousness: sedated  Airway & Oxygen Therapy: Patient Spontanous Breathing and Patient connected to face mask oxygen  Post-op Assessment: Report given to RN and Post -op Vital signs reviewed and stable  Post vital signs: Reviewed  Last Vitals:  Vitals Value Taken Time  BP    Temp    Pulse 52 03/22/20 1909  Resp 12 03/22/20 1909  SpO2 99 % 03/22/20 1909  Vitals shown include unvalidated device data.  Last Pain:  Vitals:   03/22/20 1525  TempSrc:   PainSc: 8          Complications: No complications documented.

## 2020-03-22 NOTE — ED Notes (Signed)
Pt wishes to hold off on morphine at this time

## 2020-03-22 NOTE — Anesthesia Procedure Notes (Signed)
Procedure Name: Intubation Performed by: Hayden Mabin, CRNA Pre-anesthesia Checklist: Patient identified, Patient being monitored, Timeout performed, Emergency Drugs available and Suction available Patient Re-evaluated:Patient Re-evaluated prior to induction Oxygen Delivery Method: Circle system utilized Preoxygenation: Pre-oxygenation with 100% oxygen Induction Type: IV induction and Rapid sequence Laryngoscope Size: McGraph and 4 Grade View: Grade I Tube type: Oral Tube size: 7.5 mm Number of attempts: 1 Airway Equipment and Method: Stylet and Video-laryngoscopy Placement Confirmation: ETT inserted through vocal cords under direct vision,  positive ETCO2 and breath sounds checked- equal and bilateral Secured at: 22 cm Tube secured with: Tape Dental Injury: Teeth and Oropharynx as per pre-operative assessment        

## 2020-03-22 NOTE — Anesthesia Postprocedure Evaluation (Signed)
Anesthesia Post Note  Patient: Hunter Sweeney  Procedure(s) Performed: XI ROBOTIC LAPAROSCOPIC ASSISTED APPENDECTOMY (N/A Abdomen)  Patient location during evaluation: PACU Anesthesia Type: General Level of consciousness: awake and alert Pain management: pain level controlled Vital Signs Assessment: post-procedure vital signs reviewed and stable Respiratory status: spontaneous breathing, nonlabored ventilation and respiratory function stable Cardiovascular status: blood pressure returned to baseline and stable Postop Assessment: no apparent nausea or vomiting Anesthetic complications: no   No complications documented.   Last Vitals:  Vitals:   03/22/20 2053 03/22/20 2111  BP: 119/65 114/65  Pulse: (!) 57 (!) 50  Resp: 13 16  Temp: 36.5 C (!) 36.4 C  SpO2: 100% 100%    Last Pain:  Vitals:   03/22/20 2111  TempSrc: Oral  PainSc:                  Tera Mater

## 2020-03-22 NOTE — ED Triage Notes (Addendum)
Pt c/o RLQ pain that started last night, pt was sent from [facility]. Denies N/V/D. last BM was yesterday States they resulted labs at [facility]

## 2020-03-22 NOTE — Op Note (Signed)
Preoperative diagnosis: acute appendicitis  Postoperative diagnosis: Same  Procedure: Robotic assisted laparoscopic appendectomy.  Anesthesia: GETA  Surgeon: Benjamine Sprague  Wound Classification: clean contaminated  Specimen: Appendix  Complications: None  Estimated Blood Loss: 3 mL   Indications: Patient is a 29 y.o. male  presented with above.  Please see H&P for further details.    FIndings: 1.  Irritated appendix  2. No peri-appendiceal abscess or phlegmon 3. Normal anatomy 4. Appendiceal artery ligated and divided with stapler 5. Adequate hemostasis.   Description of procedure: The patient was placed on the operating table in the supine position, left arm tucked. General anesthesia was induced. A time-out was completed verifying correct patient, procedure, site, positioning, and implant(s) and/or special equipment prior to beginning this procedure. The abdomen was prepped and draped in the usual sterile fashion.   Palmer's point located and Veress needle was inserted.  After confirming 2 clicks and a positive saline drop test, gas insufflation was initiated until the abdominal pressure was measured at 15 mmHg.  Afterwards, the Veress needle was removed and a 8 mm port was placed through a periumbilical site using Optiview technique after incision with an 11 blade.  After local was infused, 2 additional incision was made 8 cm apart each side along the left side of the abdominal wall from the initial incision.  An 8 mm port was caudaed and 35mm port cephalad from initial incision, both under direct visualization.  No injuries from trocar placements were noted. The table was placed in the Trendelenburg position with the right side elevated.  Xi robotic platform was then brought to the operative field and docked.  An inflamed appendix was identified in the retrocecal space and elevated into view by retracting the colon medially.  Loose attachments to the surrounding tissue were  dissected using scissors.  White load was then used to transect the mesoappendix and its surrounding attachments to further free up the appendix and to visualize the base. A blue load linear cutting stapler was then used to divide and staple the base of the appendix.  Bleeding from the staple lines were controlled with electrocautery.  The appendix was placed in an endoscopic retrieval bag and removed.   The appendiceal stump and mesoappendix staple line examined again and hemostasis noted. No other pathology was identified within pelvis. The 12 mm trocar removed and port site closed with PMI using 0 vicryl under direct vision. Remaining trocars were removed under direct vision. No bleeding was noted.The abdomen was allowed to collapse. 3-0 vicryl interrupted used to close 32mm port site at dermal level, and then all skin incisions then closed with subcuticular sutures Monocryl 4-0.  Wounds then dressed with dermabond.  The patient tolerated the procedure well, awakened from anesthesia and was taken to the postanesthesia care unit in satisfactory condition.  Sponge count and instrument count correct at the end of the procedure.

## 2020-03-22 NOTE — ED Provider Notes (Signed)
[facility] Emergency Department Provider Note   ____________________________________________    I have reviewed the triage vital signs and the nursing notes.   HISTORY  Chief Complaint Abdominal Pain     HPI Hunter Sweeney is a 29 y.o. male who presents with complaints of abdominal pain.  Patient reports last night he developed pressure in his right lower quadrant, it woke him up a couple of times overnight and this morning he continued to have pain so he saw his PCP.  PCP referred him to the emergency department for concern for appendicitis.  Patient denies a history of abdominal surgery.  Is not taken anything for this.  No fevers or chills.  No nausea or vomiting.  Reports the pain is moderate in intensity.  No radiation.  No history of kidney stones.  No hematuria or dysuria.  Past Medical History:  Diagnosis Date  . Melanoma ([place])     There are no problems to display for this patient.   Past Surgical History:  Procedure Laterality Date  . TONSILLECTOMY      Prior to Admission medications   Not on File     Allergies Amoxicillin  No family history on file.  Social History Social History   Tobacco Use  . Smoking status: Never Smoker  . Smokeless tobacco: Never Used  Substance Use Topics  . Alcohol use: Yes  . Drug use: Not Currently    Review of Systems  Constitutional: No fever/chills Eyes: No visual changes.  ENT: No sore throat. Cardiovascular: Denies chest pain. Respiratory: Denies shortness of breath. Gastrointestinal: As above Genitourinary: As above Musculoskeletal: Negative for back pain. Skin: Negative for rash. Neurological: Negative for headaches or weakness   ____________________________________________   PHYSICAL EXAM:  VITAL SIGNS: ED Triage Vitals  Enc Vitals Group     BP 03/22/20 0920 134/66     Pulse Rate 03/22/20 0920 66     Resp 03/22/20 0920 16     Temp 03/22/20 0923 97.7 F (36.5 C)      Temp Source 03/22/20 0920 Oral     SpO2 03/22/20 0920 100 %     Weight 03/22/20 0921 68 kg (150 lb)     Height 03/22/20 0921 1.803 m (5\' 11" )     Head Circumference --      Peak Flow --      Pain Score 03/22/20 0921 6     Pain Loc --      Pain Edu? --      Excl. in [place]? --     Constitutional: Alert and oriented.   Nose: No congestion/rhinnorhea. Mouth/Throat: Mucous membranes are moist.   Neck:  Painless ROM Cardiovascular: Normal rate, regular rhythm.   Good peripheral circulation. Respiratory: Normal respiratory effort.  No retractions. Gastrointestinal: Tenderness palpation the right lower quadrant at McBurney's point. No distention.  No CVA tenderness. Genitourinary: deferred Musculoskeletal:  Warm and well perfused Neurologic:  Normal speech and language. No gross focal neurologic deficits are appreciated.  Skin:  Skin is warm, dry and intact. No rash noted. Psychiatric: Mood and affect are normal. Speech and behavior are normal.  ____________________________________________   LABS (all labs ordered are listed, but only abnormal results are displayed)  Labs Reviewed  CBC - Abnormal; Notable for the following components:      Result Value   WBC 11.3 (*)    All other components within normal limits  BASIC METABOLIC PANEL - Abnormal; Notable for the following components:  Glucose, Bld 139 (*)    All other components within normal limits  SARS CORONAVIRUS 2 BY RT PCR (HOSPITAL ORDER, [place] [facility])  URINALYSIS, COMPLETE (UACMP) WITH MICROSCOPIC   ____________________________________________  EKG  None ____________________________________________  RADIOLOGY  CT abdomen pelvis ____________________________________________   PROCEDURES  Procedure(s) performed: No  Procedures   Critical Care performed: No ____________________________________________   INITIAL IMPRESSION / ASSESSMENT AND PLAN / ED COURSE  Pertinent labs & imaging  results that were available during my care of the patient were reviewed by me and considered in my medical decision making (see chart for details).  Patient presents with right lower quadrant pain as described above.  Suspicious for appendicitis, differential includes ureterolithiasis, colitis, less likely urinary tract infection.  We will give IV morphine, IV Zofran, obtain CT abdomen pelvis to evaluate the appendix.  CT scan is consistent with acute appendicitis.  Have consulted Dr. Lysle Pearl of general surgery, Covid swab ordered.    ____________________________________________   FINAL CLINICAL IMPRESSION(S) / ED DIAGNOSES  Final diagnoses:  Acute appendicitis, unspecified acute appendicitis type        Note:  This document was prepared using Dragon voice recognition software and may include unintentional dictation errors.   Lavonia Drafts, MD 03/22/20 1049

## 2020-03-22 NOTE — ED Notes (Signed)
Surgery at bedside.

## 2020-03-22 NOTE — H&P (Signed)
Subjective:   CC: appendicitis  HPI:  Hunter Sweeney is a 29 y.o. male who is consulted by Corky Downs for evaluation of  above cc.  Symptoms were first noted 1 days ago. Pain is sharp, RLQ.  Associated with Nothing specific, exacerbated by nothing specific    Past Medical History:  has a past medical history of Melanoma ([place]).  Past Surgical History:  has a past surgical history that includes Tonsillectomy.  Family History: family history is not on file.  Social History:  reports that he has never smoked. He has never used smokeless tobacco. He reports current alcohol use. He reports previous drug use.  Current Medications:  acetaminophen (TYLENOL) 500 MG tablet Take 500-1,000 mg by mouth every 6 (six) hours as needed for mild pain or fever. [provider] Needs Review  ibuprofen (ADVIL) 200 MG tablet Take 200-600 mg by mouth every 6 (six) hours as needed for fever or mild pain. [provider] Needs Review  valACYclovir (VALTREX) 1000 MG tablet Take 4,000 mg by mouth daily as needed (outbreaks). [provider] Needs Review     Allergies:  Allergies as of 03/22/2020 - Review Complete 03/22/2020  Allergen Reaction Noted  . Amoxicillin Hives 03/22/2020    ROS:  General: Denies weight loss, weight gain, fatigue, fevers, chills, and night sweats. Eyes: Denies blurry vision, double vision, eye pain, itchy eyes, and tearing. Ears: Denies hearing loss, earache, and ringing in ears. Nose: Denies sinus pain, congestion, infections, runny nose, and nosebleeds. Mouth/throat: Denies hoarseness, sore throat, bleeding gums, and difficulty swallowing. Heart: Denies chest pain, palpitations, racing heart, irregular heartbeat, leg pain or swelling, and decreased activity tolerance. Respiratory: Denies breathing difficulty, shortness of breath, wheezing, cough, and sputum. GI: Denies change in appetite, heartburn, nausea, vomiting, constipation, diarrhea, and blood in  stool. GU: Denies difficulty urinating, pain with urinating, urgency, frequency, blood in urine. Musculoskeletal: Denies joint stiffness, pain, swelling, muscle weakness. Skin: Denies rash, itching, mass, tumors, sores, and boils Neurologic: Denies headache, fainting, dizziness, seizures, numbness, and tingling. Psychiatric: Denies depression, anxiety, difficulty sleeping, and memory loss. Endocrine: Denies heat or cold intolerance, and increased thirst or urination. Blood/lymph: Denies easy bruising, easy bruising, and swollen glands     Objective:     BP (!) 116/58   Pulse 67   Temp 97.7 F (36.5 C)   Resp 19   Ht 5\' 11"  (1.803 m)   Wt 68 kg   SpO2 100%   BMI 20.92 kg/m    Constitutional :  alert, cooperative, appears stated age and no distress  Lymphatics/Throat:  no asymmetry, masses, or scars  Respiratory:  clear to auscultation bilaterally  Cardiovascular:  regular rate and rhythm  Gastrointestinal: soft, no guarding, focal TTP in RLQ.   Musculoskeletal: Steady gait and movement  Skin: Cool and moist  Psychiatric: Normal affect, non-agitated, not confused       LABS:  CMP Latest Ref Rng & Units 03/22/2020  Glucose 70 - 99 mg/dL 139(H)  BUN 6 - 20 mg/dL 16  Creatinine 0.61 - 1.24 mg/dL 0.99  Sodium 135 - 145 mmol/L 135  Potassium 3.5 - 5.1 mmol/L 4.0  Chloride 98 - 111 mmol/L 100  CO2 22 - 32 mmol/L 24  Calcium 8.9 - 10.3 mg/dL 9.2   CBC Latest Ref Rng & Units 03/22/2020  WBC 4.0 - 10.5 K/uL 11.3(H)  Hemoglobin 13.0 - 17.0 g/dL 15.2  Hematocrit 39 - 52 % 43.6  Platelets 150 - 400 K/uL 251  RADS: CLINICAL DATA:  Acute right lower quadrant pain since last night  EXAM: CT ABDOMEN AND PELVIS WITH CONTRAST  TECHNIQUE: Multidetector CT imaging of the abdomen and pelvis was performed using the standard protocol following bolus administration of intravenous contrast.  CONTRAST:  131mL OMNIPAQUE IOHEXOL 300 MG/ML  SOLN  COMPARISON:   None.  FINDINGS: Lower chest: No acute abnormality.  Hepatobiliary: No focal liver abnormality is seen. No gallstones, gallbladder wall thickening, or biliary dilatation.  Pancreas: Unremarkable. No pancreatic ductal dilatation or surrounding inflammatory changes.  Spleen: Normal in size without focal abnormality.  Adrenals/Urinary Tract: Adrenal glands are unremarkable. Kidneys are normal, without renal calculi, focal lesion, or hydronephrosis. Bladder is unremarkable.  Stomach/Bowel: Negative for bowel obstruction, significant dilatation, ileus, or free air.  In the right lower quadrant the appendix is dilated with mucosal enhancement and surrounding inflammation. Retrocecal location. Appearance compatible with acute nonruptured appendicitis.  Appendix: Location: Retrocecal  Diameter: 11 mm  Appendicolith: Not visualized  Mucosal hyper-enhancement: Present  Extraluminal gas: None.  Periappendiceal collection: None.  No significant fluid collection, abscess, hemorrhage or hematoma. Trace periappendiceal fluid and trace dependent fluid in the pelvis.  Vascular/Lymphatic: No bulky adenopathy  Reproductive: No significant finding by CT  Other: Intact abdominal wall.  No hernia.  No inguinal abnormality.  Musculoskeletal: No acute osseous finding.  IMPRESSION: Acute retrocecal nonruptured appendicitis.   Electronically Signed   By: Jerilynn Mages.  Shick M.D.   On: 03/22/2020 10:34  Assessment:      Acute appendicitis  Plan:      Discussed the risk of surgery including post-op infxn, seroma, hematoma, abscess formation, chronic pain, poor-delayed wound healing, possible bowel resection, possible ostomy, possible conversion to open procedure, post-op SBO or ileus, and need for additional procedures to address said risks.  The risks of general anesthetic including MI, CVA, sudden death or even reaction to anesthetic medications also discussed.  Alternatives include continued observation, or antibiotic treatment.  Benefits include possible symptom relief,   Typical post operative recovery of 3-5 days rest, also discussed.  The patient understands the risks, any and all questions were answered to the patient's satisfaction.  To OR for robotic lap appy, discussed retrocecal nature and possible open if needed.  He verbalized understanding

## 2020-03-23 ENCOUNTER — Encounter: Payer: Self-pay | Admitting: Surgery

## 2020-03-23 LAB — CBC
HCT: 40.8 % (ref 39.0–52.0)
Hemoglobin: 14.4 g/dL (ref 13.0–17.0)
MCH: 30.4 pg (ref 26.0–34.0)
MCHC: 35.3 g/dL (ref 30.0–36.0)
MCV: 86.3 fL (ref 80.0–100.0)
Platelets: 235 10*3/uL (ref 150–400)
RBC: 4.73 MIL/uL (ref 4.22–5.81)
RDW: 12.1 % (ref 11.5–15.5)
WBC: 8.9 10*3/uL (ref 4.0–10.5)
nRBC: 0 % (ref 0.0–0.2)

## 2020-03-23 LAB — BASIC METABOLIC PANEL
Anion gap: 9 (ref 5–15)
BUN: 14 mg/dL (ref 6–20)
CO2: 27 mmol/L (ref 22–32)
Calcium: 9.1 mg/dL (ref 8.9–10.3)
Chloride: 101 mmol/L (ref 98–111)
Creatinine, Ser: 0.97 mg/dL (ref 0.61–1.24)
GFR calc Af Amer: >60 mL/min (ref >60)
GFR calc non Af Amer: >60 mL/min (ref >60)
Glucose, Bld: 135 mg/dL — ABNORMAL HIGH (ref 70–99)
Potassium: 4.5 mmol/L (ref 3.5–5.1)
Sodium: 137 mmol/L (ref 135–145)

## 2020-03-23 MED ORDER — ACETAMINOPHEN 325 MG PO TABS
650.0000 mg | ORAL_TABLET | Freq: Three times a day (TID) | ORAL | 0 refills | Status: AC | PRN
Start: 2020-03-23 — End: 2020-04-22

## 2020-03-23 MED ORDER — IBUPROFEN 800 MG PO TABS
800.0000 mg | ORAL_TABLET | Freq: Three times a day (TID) | ORAL | 0 refills | Status: AC | PRN
Start: 2020-03-23 — End: ?

## 2020-03-23 MED ORDER — DOCUSATE SODIUM 100 MG PO CAPS
100.0000 mg | ORAL_CAPSULE | Freq: Two times a day (BID) | ORAL | 0 refills | Status: AC | PRN
Start: 2020-03-23 — End: 2020-04-02

## 2020-03-23 MED ORDER — HYDROCODONE-ACETAMINOPHEN 5-325 MG PO TABS: 1.0000 | ORAL_TABLET | Freq: Four times a day (QID) | ORAL | 0 refills | Status: AC | PRN

## 2020-03-23 MED ORDER — DOCUSATE SODIUM 100 MG PO CAPS: 100.0000 mg | ORAL_CAPSULE | Freq: Two times a day (BID) | ORAL | Status: DC | PRN

## 2020-03-23 NOTE — Discharge Summary (Addendum)
Physician Discharge Summary  Patient ID: Hunter Sweeney MRN: [id number] DOB/AGE: 11/08/1990 29 y.o.  Admit date: 03/22/2020 Discharge date: 03/23/20  Admission Diagnoses: acute appendicitis  Discharge Diagnoses:  Same as above  Discharged Condition: good  Hospital Course: admitted for above.  Underwent uncomplicated robo lap appy.  See op note.  Recovered as expected postop. Pain controlled, diet tolerating at time of d/c  Consults: None  Discharge Exam: Blood pressure 108/67, pulse (!) 54, temperature 97.7 F (36.5 C), temperature source Oral, resp. rate 20, height 5\' 11"  (1.803 m), weight 68 kg, SpO2 100 %. General appearance: alert, cooperative and no distress GI: soft, no guarding, focal TTP around incision site as appropriate.  improved RLQ pain  Disposition:  Discharge disposition: 01-Home or Self Care       Discharge Instructions    Discharge patient   Complete by: As directed    Discharge disposition: 01-Home or Self Care   Discharge patient date: 03/23/2020     Allergies as of 03/23/2020      Reactions   Amoxicillin Hives      Medication List    STOP taking these medications   valACYclovir 1000 MG tablet Commonly known as: VALTREX     TAKE these medications   acetaminophen 325 MG tablet Commonly known as: Tylenol Take 2 tablets (650 mg total) by mouth every 8 (eight) hours as needed for mild pain. What changed:   medication strength  how much to take  when to take this  reasons to take this   docusate sodium 100 MG capsule Commonly known as: Colace Take 1 capsule (100 mg total) by mouth 2 (two) times daily as needed for up to 10 days for mild constipation.   HYDROcodone-acetaminophen 5-325 MG tablet Commonly known as: Norco Take 1 tablet by mouth every 6 (six) hours as needed for up to 10 doses for moderate pain.   ibuprofen 800 MG tablet Commonly known as: ADVIL Take 1 tablet (800 mg total) by mouth every 8 (eight) hours as needed for mild  pain or moderate pain. What changed:   medication strength  how much to take  when to take this  reasons to take this       Follow-up Information    Raeshawn Vo, DO Follow up in 2 week(s).   Specialty: Surgery Why: post op appendectomy Contact information: [address] [place] [address] [phone]                Total time spent arranging discharge was >41min. Signed: Benjamine Sprague 03/23/2020, 9:32 AM

## 2020-03-23 NOTE — Plan of Care (Signed)
Discharge teaching completed with patient who verbalized understanding of teaching.  Patient is in stable condition and with all belongings.

## 2020-03-23 NOTE — Plan of Care (Signed)
Continuing with plan of care. 

## 2020-03-23 NOTE — Discharge Instructions (Signed)
Laparoscopic Appendectomy, Care After This sheet gives you information about how to care for yourself after your procedure. Your doctor may also give you more specific instructions. If you have problems or questions, contact your doctor. Follow these instructions at home: Care for cuts from surgery (incisions)   Follow instructions from your doctor about how to take care of your cuts from surgery. Make sure you: ? Wash your hands with soap and water before you change your bandage (dressing). If you cannot use soap and water, use hand sanitizer. ? Change your bandage as told by your doctor. ? Leave stitches (sutures), skin glue, or skin tape (adhesive) strips in place. They may need to stay in place for 2 weeks or longer. If tape strips get loose and curl up, you may trim the loose edges. Do not remove tape strips completely unless your doctor says it is okay.  Do not take baths, swim, or use a hot tub until your doctor says it is okay. OK TO SHOWER 24HRS AFTER YOUR SURGERY.   Check your surgical cut area every day for signs of infection. Check for: ? More redness, swelling, or pain. ? More fluid or blood. ? Warmth. ? Pus or a bad smell. Activity  Do not drive or use heavy machinery while taking prescription pain medicine.  Do not play contact sports until your doctor says it is okay.  Do not drive for 24 hours if you were given a medicine to help you relax (sedative).  Rest as needed. Do not return to work or school until your doctor says it is okay. General instructions .  tylenol and advil as needed for discomfort.  Please alternate between the two every four hours as needed for pain.   .  Use narcotics, if prescribed, only when tylenol and motrin is not enough to control pain. .  325-650mg every 8hrs to max of 3000mg/24hrs (including the 325mg in every norco dose) for the tylenol.   .  Advil up to 800mg per dose every 8hrs as needed for pain.    To prevent or treat constipation  while you are taking prescription pain medicine, your doctor may recommend that you: ? Drink enough fluid to keep your pee (urine) clear or pale yellow. ? Take over-the-counter or prescription medicines. ? Eat foods that are high in fiber, such as fresh fruits and vegetables, whole grains, and beans. ? Limit foods that are high in fat and processed sugars, such as fried and sweet foods. Contact a doctor if:  You develop a rash.  You have more redness, swelling, or pain around your surgical cuts.  You have more fluid or blood coming from your surgical cuts.  Your surgical cuts feel warm to the touch.  You have pus or a bad smell coming from your surgical cuts.  You have a fever.  One or more of your surgical cuts breaks open. Get help right away if:  You have trouble breathing.  You have chest pain.  You have pain that is getting worse in your shoulders.  You faint or feel dizzy when you stand.  You have very bad pain in your belly (abdomen).  You are sick to your stomach (nauseous) for more than one day.  You have throwing up (vomiting) that lasts for more than one day.  You have leg pain. This information is not intended to replace advice given to you by your health care provider. Make sure you discuss any questions you have with your   health care provider. Document Released: 06/09/2008 Document Revised: 03/21/2016 Document Reviewed: 02/17/2016 [employer] Interactive Patient Education  2019 [employer]   

## 2020-03-26 LAB — SURGICAL PATHOLOGY
